# Patient Record
Sex: Female | Born: 1995 | Race: White | Hispanic: No | Marital: Single
Health system: Southern US, Community
[De-identification: ages and names within clinical notes are randomized; demographics above are authoritative.]

---

## 2020-12-23 ENCOUNTER — Emergency Department
Admission: EM | Admit: 2020-12-23 | Discharge: 2020-12-24 | Disposition: A | Discharge status: Home / Self Care | Payer: No Typology Code available for payment source | Attending: Emergency Medicine | Admitting: Emergency Medicine

## 2020-12-23 ENCOUNTER — Emergency Department: Payer: No Typology Code available for payment source

## 2020-12-23 ENCOUNTER — Other Ambulatory Visit: Payer: Self-pay

## 2020-12-23 DIAGNOSIS — R110 Nausea: Secondary | ICD-10-CM | POA: Insufficient documentation

## 2020-12-23 DIAGNOSIS — S0990XA Unspecified injury of head, initial encounter: Secondary | ICD-10-CM | POA: Diagnosis present

## 2020-12-23 DIAGNOSIS — S060X9A Concussion with loss of consciousness of unspecified duration, initial encounter: Secondary | ICD-10-CM

## 2020-12-23 DIAGNOSIS — M545 Low back pain, unspecified: Secondary | ICD-10-CM | POA: Insufficient documentation

## 2020-12-23 DIAGNOSIS — M542 Cervicalgia: Secondary | ICD-10-CM | POA: Diagnosis not present

## 2020-12-23 DIAGNOSIS — R52 Pain, unspecified: Secondary | ICD-10-CM

## 2020-12-23 DIAGNOSIS — Y92410 Unspecified street and highway as the place of occurrence of the external cause: Secondary | ICD-10-CM | POA: Insufficient documentation

## 2020-12-23 DIAGNOSIS — R519 Headache, unspecified: Secondary | ICD-10-CM

## 2020-12-23 LAB — POC URINE PREG, ED: Preg Test, Ur: NEGATIVE

## 2020-12-23 MED ORDER — ACETAMINOPHEN 325 MG PO TABS
650.0000 mg | ORAL_TABLET | Freq: Once | ORAL | Status: AC
  Administered 2020-12-23: 650 mg via ORAL
  Filled 2020-12-23: qty 2
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
## 2020-12-24 ENCOUNTER — Emergency Department: Payer: No Typology Code available for payment source

## 2020-12-24 LAB — CBC WITH DIFFERENTIAL/PLATELET
Abs Immature Granulocytes: 0.03 10*3/uL (ref 0.00–0.07)
Basophils Absolute: 0 10*3/uL (ref 0.0–0.1)
Basophils Relative: 0 %
Eosinophils Absolute: 0 10*3/uL (ref 0.0–0.5)
Eosinophils Relative: 0 %
HCT: 38.4 % (ref 36.0–46.0)
Hemoglobin: 13.1 g/dL (ref 12.0–15.0)
Immature Granulocytes: 0 %
Lymphocytes Relative: 17 %
Lymphs Abs: 1.5 10*3/uL (ref 0.7–4.0)
MCH: 30.6 pg (ref 26.0–34.0)
MCHC: 34.1 g/dL (ref 30.0–36.0)
MCV: 89.7 fL (ref 80.0–100.0)
Monocytes Absolute: 0.5 10*3/uL (ref 0.1–1.0)
Monocytes Relative: 6 %
Neutro Abs: 7 10*3/uL (ref 1.7–7.7)
Neutrophils Relative %: 77 %
Platelets: 234 10*3/uL (ref 150–400)
RBC: 4.28 MIL/uL (ref 3.87–5.11)
RDW: 11.8 % (ref 11.5–15.5)
WBC: 9.1 10*3/uL (ref 4.0–10.5)
nRBC: 0 % (ref 0.0–0.2)

## 2020-12-24 LAB — URINALYSIS, COMPLETE (UACMP) WITH MICROSCOPIC
Bacteria, UA: NONE SEEN
Bilirubin Urine: NEGATIVE
Glucose, UA: NEGATIVE mg/dL
Hgb urine dipstick: NEGATIVE
Ketones, ur: NEGATIVE mg/dL
Leukocytes,Ua: NEGATIVE
Nitrite: NEGATIVE
Protein, ur: NEGATIVE mg/dL
Specific Gravity, Urine: 1.001 — ABNORMAL LOW (ref 1.005–1.030)
WBC, UA: NONE SEEN WBC/hpf (ref 0–5)
pH: 7 (ref 5.0–8.0)

## 2020-12-24 LAB — BASIC METABOLIC PANEL
Anion gap: 7 (ref 5–15)
BUN: 16 mg/dL (ref 6–20)
CO2: 23 mmol/L (ref 22–32)
Calcium: 9.3 mg/dL (ref 8.9–10.3)
Chloride: 105 mmol/L (ref 98–111)
Creatinine, Ser: 0.87 mg/dL (ref 0.44–1.00)
GFR, Estimated: >60 mL/min (ref >60)
Glucose, Bld: 99 mg/dL (ref 70–99)
Potassium: 3.8 mmol/L (ref 3.5–5.1)
Sodium: 135 mmol/L (ref 135–145)
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
## 2020-12-24 MED ORDER — IOHEXOL 300 MG/ML  SOLN
100.0000 mL | Freq: Once | INTRAMUSCULAR | Status: AC | PRN
  Administered 2020-12-24: 100 mL via INTRAVENOUS
  Filled 2020-12-24: qty 100
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
## 2020-12-24 NOTE — Discharge Instructions (Signed)
